# Patient Record
Sex: Male | Born: 1975 | Race: Black or African American | Hispanic: No | Marital: Single | Smoking: Current every day smoker
Health system: Southern US, Community
[De-identification: ages and names within clinical notes are randomized; demographics above are authoritative.]

## PROBLEM LIST (undated history)

## (undated) DIAGNOSIS — J329 Chronic sinusitis, unspecified: Secondary | ICD-10-CM

## (undated) DIAGNOSIS — R51 Headache: Secondary | ICD-10-CM

---

## 2009-09-01 ENCOUNTER — Emergency Department (HOSPITAL_COMMUNITY): Admission: EM | Admit: 2009-09-01 | Discharge: 2009-09-01 | Payer: Self-pay | Admitting: Emergency Medicine

## 2011-08-01 ENCOUNTER — Emergency Department (HOSPITAL_COMMUNITY)
Admission: EM | Admit: 2011-08-01 | Discharge: 2011-08-01 | Disposition: A | Discharge status: Home / Self Care | Payer: Self-pay | Attending: Emergency Medicine | Admitting: Emergency Medicine

## 2011-08-01 ENCOUNTER — Encounter (HOSPITAL_COMMUNITY): Payer: Self-pay | Admitting: Emergency Medicine

## 2011-08-01 DIAGNOSIS — F17200 Nicotine dependence, unspecified, uncomplicated: Secondary | ICD-10-CM | POA: Insufficient documentation

## 2011-08-01 DIAGNOSIS — J329 Chronic sinusitis, unspecified: Secondary | ICD-10-CM | POA: Insufficient documentation

## 2011-08-01 HISTORY — DX: Chronic sinusitis, unspecified: J32.9

## 2011-08-01 HISTORY — DX: Headache: R51

## 2011-08-01 MED ORDER — AMOXICILLIN 500 MG PO CAPS: 500.0000 mg | ORAL_CAPSULE | Freq: Three times a day (TID) | ORAL | Status: AC

## 2011-08-01 MED ORDER — FLUTICASONE PROPIONATE 50 MCG/ACT NA SUSP: NASAL | Status: DC

## 2011-08-01 NOTE — ED Notes (Signed)
Pt reports sinus infection with headache x 2 weeks. Pt has taken over the counter sinus meditation with ibuprofen without relief. Pt had nausea and vomiting yesterday, but none today.

## 2011-08-01 NOTE — ED Provider Notes (Signed)
History   This chart was scribed for Jason Melter, MD by Brooks Sailors. The patient was seen in room STRE5/STRE5. Patient's care was started at 1108.   CSN: [id number]  Arrival date & time 08/01/11  1108   First MD Initiated Contact with Patient 08/01/11 1205      Chief Complaint  Patient presents with  . Recurrent Sinusitis  . Headache    (Consider location/radiation/quality/duration/timing/severity/associated sxs/prior treatment) HPI  Jason Garcia is a 36 y.o. male who presents to the Emergency Department complaining of a sinus infection on the left side with associated migraines, eye burning, dizziness, and ear poping.  Pt says this is a reoccurring problem with last episode three years ago and always on left side. Pt has tried to take sinus OTC decongestant and algesics medicine without much relief. Denies sore throat, cough, SOB, and fever.    Past Medical History  Diagnosis Date  . Sinus infection   . Headache     History reviewed. No pertinent past surgical history.  History reviewed. No pertinent family history.  History  Substance Use Topics  . Smoking status: Current Everyday Smoker  . Smokeless tobacco: Not on file  . Alcohol Use: Yes      Review of Systems  All other systems reviewed and are negative.    Allergies  Review of patient's allergies indicates no known allergies.  Home Medications   Current Outpatient Rx  Name Route Sig Dispense Refill  . ACETAMINOPHEN 500 MG PO TABS Oral Take 1,000 mg by mouth every 6 (six) hours as needed. For headache    . IBUPROFEN 200 MG PO TABS Oral Take 200 mg by mouth every 6 (six) hours as needed. For headache    . PHENYLEPH-DOXYLAMINE-DM-APAP 5-6.25-10-325 MG PO CAPS Oral Take 2 capsules by mouth 2 (two) times daily as needed. For allergies    . AMOXICILLIN 500 MG PO CAPS Oral Take 1 capsule (500 mg total) by mouth 3 (three) times daily. 21 capsule 0  . FLUTICASONE PROPIONATE 50 MCG/ACT NA SUSP  1 spray  each nostril twice a day 16 g 0    BP 118/71  Pulse 68  Temp(Src) 98.6 F (37 C) (Oral)  Resp 14  Ht 6\' 1"  (1.854 m)  Wt 160 lb (72.576 kg)  BMI 21.11 kg/m2  SpO2 100%  Physical Exam  Nursing note and vitals reviewed. Constitutional: He is oriented to person, place, and time. He appears well-developed and well-nourished.  HENT:  Head: Normocephalic.  Right Ear: External ear normal.  Left Ear: External ear normal.       3cm circular mass on forehead, firm, nontender. Tenderness with percussion to left and frontal maxillary sinuses.  Eyes: Conjunctivae and EOM are normal. Pupils are equal, round, and reactive to light.  Neck: Normal range of motion and phonation normal. Neck supple.  Cardiovascular: Normal rate, regular rhythm, normal heart sounds and intact distal pulses.   Pulmonary/Chest: Effort normal and breath sounds normal. He exhibits no bony tenderness.  Abdominal: Normal appearance. There is no tenderness.  Musculoskeletal: Normal range of motion.  Neurological: He is alert and oriented to person, place, and time. He has normal strength. No cranial nerve deficit or sensory deficit. He exhibits normal muscle tone. Coordination normal.  Skin: Skin is warm, dry and intact.  Psychiatric: He has a normal mood and affect. His behavior is normal. Judgment and thought content normal.    ED Course  Procedures (including critical care time)  1225 Pt  seen and agrees with course of care.   Labs Reviewed - No data to display No results found.   1. Sinusitis       MDM  Eval. C/w bacterial sinusitis vs. Allergic sinusitis. Doubt metabolic instability, serious bacterial infection or impending vascular collapse; the patient is stable for discharge.   Plan: Home Medications- Amox., Flonase; Home Treatments- rest; Recommended follow up- PCP prn      I personally performed the services described in this documentation, which was scribed in my presence. The recorded  information has been reviewed and considered.     Jason Melter, MD 08/02/11 [phone]

## 2011-08-01 NOTE — Discharge Instructions (Signed)
Use of the medicines as directed. You can also use Afrin twice a day, to help with congestion, while using the nasal steroid medication. See the Dr. of your choice for problems.   Sinusitis Sinuses are air pockets within the bones of your face. The growth of bacteria within a sinus leads to infection. The infection prevents the sinuses from draining. This infection is called sinusitis. SYMPTOMS  There will be different areas of pain depending on which sinuses have become infected.  The maxillary sinuses often produce pain beneath the eyes.   Frontal sinusitis may cause pain in the middle of the forehead and above the eyes.  Other problems (symptoms) include:  Toothaches.   Colored, pus-like (purulent) drainage from the nose.   Swelling, warmth, and tenderness over the sinus areas may be signs of infection.  TREATMENT  Sinusitis is most often determined by an exam.X-rays may be taken. If x-rays have been taken, make sure you obtain your results or find out how you are to obtain them. Your caregiver may give you medications (antibiotics). These are medications that will help kill the bacteria causing the infection. You may also be given a medication (decongestant) that helps to reduce sinus swelling.  HOME CARE INSTRUCTIONS   Only take over-the-counter or prescription medicines for pain, discomfort, or fever as directed by your caregiver.   Drink extra fluids. Fluids help thin the mucus so your sinuses can drain more easily.   Applying either moist heat or ice packs to the sinus areas may help relieve discomfort.   Use saline nasal sprays to help moisten your sinuses. The sprays can be found at your local drugstore.  SEEK IMMEDIATE MEDICAL CARE IF:  You have a fever.   You have increasing pain, severe headaches, or toothache.   You have nausea, vomiting, or drowsiness.   You develop unusual swelling around the face or trouble seeing.  MAKE SURE YOU:   Understand these  instructions.   Will watch your condition.   Will get help right away if you are not doing well or get worse.  Document Released: 02/19/2005 Document Revised: 02/08/2011 Document Reviewed: 09/18/2006 [facility] Patient Information 2012 [place], [place].  RESOURCE GUIDE  Dental Problems  Patients with Medicaid: [facility] [phone] [address]                                           [phone] [address] Phone:  [phone]                                                  Phone:  [phone]  If unable to pay or uninsured, contact:  [facility] or [facility] to become qualified for the adult dental clinic.  Chronic Pain Problems Contact [facility]  [phone] Patients need to be referred by their primary care doctor.  Insufficient Money for Medicine Contact [employer]:  call "211" or [facility] [phone].  No Primary Care Doctor Call [facility]  [phone] Other agencies that provide inexpensive medical care    [facility]  [phone]    [facility]  [phone]    [facility]  [phone]    [facility]  [phone]    [facility]  [phone]    [facility]  [phone]  Psychological Services [facility]  [phone] [facility]  [phone] [facility]   [phone] (emergency services [phone])  Substance Abuse Resources [facility]  [phone] [facility] [phone] [facility] [place] [phone] Floydene Flock 5077811569 Residential & Outpatient Substance Abuse Program  [phone]  Abuse/Neglect [facility] Child Abuse Hotline 503-184-7013 [facility] Child Abuse Hotline 218-332-0574 (After Hours)  Emergency Shelter [facility] [phone]  Maternity Homes [facility] at the [place] [facility] [place] [phone] Rebeca Alert  Services [phone]  MRSA Hotline #:   857 318 8916    [facility] Resources  [facility]     [employer]                          [facility] Dept. [address] [place]                       [address]      [address]  Blondell Reveal Phone:  [phone]                                   Phone:  [phone]                 Phone:  [phone]  [facility] Phone:  [phone]  [facility] Child Abuse Hotline 817-143-2089 [phone] (After Hours)

## 2011-08-01 NOTE — ED Notes (Signed)
Discharge instructions reviewed with pt; verbalizes understanding.  No questions asked; no further c/o's voiced.  Pt ambulatory to lobby.  NAD noted. 

## 2011-11-25 ENCOUNTER — Emergency Department (HOSPITAL_COMMUNITY): Payer: Self-pay

## 2011-11-25 ENCOUNTER — Emergency Department (HOSPITAL_COMMUNITY)
Admission: EM | Admit: 2011-11-25 | Discharge: 2011-11-25 | Disposition: A | Discharge status: Home / Self Care | Payer: Self-pay | Attending: Emergency Medicine | Admitting: Emergency Medicine

## 2011-11-25 DIAGNOSIS — S62309A Unspecified fracture of unspecified metacarpal bone, initial encounter for closed fracture: Secondary | ICD-10-CM

## 2011-11-25 DIAGNOSIS — S62319A Displaced fracture of base of unspecified metacarpal bone, initial encounter for closed fracture: Secondary | ICD-10-CM | POA: Insufficient documentation

## 2011-11-25 DIAGNOSIS — Y998 Other external cause status: Secondary | ICD-10-CM | POA: Insufficient documentation

## 2011-11-25 DIAGNOSIS — F17200 Nicotine dependence, unspecified, uncomplicated: Secondary | ICD-10-CM | POA: Insufficient documentation

## 2011-11-25 DIAGNOSIS — Y93I9 Activity, other involving external motion: Secondary | ICD-10-CM | POA: Insufficient documentation

## 2011-11-25 MED ORDER — OXYCODONE-ACETAMINOPHEN 5-325 MG PO TABS
2.0000 | ORAL_TABLET | Freq: Once | ORAL | Status: AC
  Administered 2011-11-25: 2 via ORAL
  Filled 2011-11-25: qty 2

## 2011-11-25 NOTE — ED Notes (Signed)
Patient transported to X-ray 
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
## 2011-11-25 NOTE — ED Notes (Signed)
Pt medicated for pain. Pt has a ride home.
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
## 2011-11-27 NOTE — ED Provider Notes (Signed)
Medical screening examination/treatment/procedure(s) were performed by non-physician practitioner and as supervising physician I was immediately available for consultation/collaboration.  Flint Melter, MD 11/27/11 2044

## 2016-10-09 ENCOUNTER — Emergency Department (HOSPITAL_COMMUNITY): Payer: Medicaid Other

## 2016-10-09 ENCOUNTER — Encounter (HOSPITAL_COMMUNITY): Payer: Self-pay | Admitting: Emergency Medicine

## 2016-10-09 ENCOUNTER — Emergency Department (HOSPITAL_COMMUNITY)
Admission: EM | Admit: 2016-10-09 | Discharge: 2016-10-09 | Disposition: A | Discharge status: Home / Self Care | Payer: Medicaid Other | Attending: Emergency Medicine | Admitting: Emergency Medicine

## 2016-10-09 DIAGNOSIS — N50819 Testicular pain, unspecified: Secondary | ICD-10-CM | POA: Diagnosis not present

## 2016-10-09 DIAGNOSIS — F17200 Nicotine dependence, unspecified, uncomplicated: Secondary | ICD-10-CM | POA: Insufficient documentation

## 2016-10-09 DIAGNOSIS — N451 Epididymitis: Secondary | ICD-10-CM | POA: Diagnosis not present

## 2016-10-09 DIAGNOSIS — R1030 Lower abdominal pain, unspecified: Secondary | ICD-10-CM

## 2016-10-09 MED ORDER — DOXYCYCLINE HYCLATE 100 MG PO CAPS: 100.0000 mg | ORAL_CAPSULE | Freq: Two times a day (BID) | ORAL | 0 refills | Status: DC

## 2016-10-09 MED ORDER — IBUPROFEN 800 MG PO TABS
800.0000 mg | ORAL_TABLET | Freq: Once | ORAL | Status: AC
  Administered 2016-10-09: 800 mg via ORAL
  Filled 2016-10-09: qty 1

## 2016-10-09 MED ORDER — AZITHROMYCIN 250 MG PO TABS
1000.0000 mg | ORAL_TABLET | Freq: Once | ORAL | Status: AC
  Administered 2016-10-09: 1000 mg via ORAL
  Filled 2016-10-09: qty 4

## 2016-10-09 MED ORDER — PROMETHAZINE HCL 12.5 MG PO TABS
12.5000 mg | ORAL_TABLET | Freq: Once | ORAL | Status: AC
  Administered 2016-10-09: 12.5 mg via ORAL
  Filled 2016-10-09: qty 1

## 2016-10-09 MED ORDER — HYDROCODONE-ACETAMINOPHEN 5-325 MG PO TABS
2.0000 | ORAL_TABLET | Freq: Once | ORAL | Status: AC
  Administered 2016-10-09: 2 via ORAL
  Filled 2016-10-09: qty 2

## 2016-10-09 MED ORDER — CEFTRIAXONE SODIUM 250 MG IJ SOLR
250.0000 mg | Freq: Once | INTRAMUSCULAR | Status: AC
  Administered 2016-10-09: 250 mg via INTRAMUSCULAR
  Filled 2016-10-09: qty 250

## 2016-10-09 MED ORDER — HYDROCODONE-ACETAMINOPHEN 5-325 MG PO TABS: 1.0000 | ORAL_TABLET | ORAL | 0 refills | Status: DC | PRN

## 2016-10-09 MED ORDER — IBUPROFEN 600 MG PO TABS: 600.0000 mg | ORAL_TABLET | Freq: Four times a day (QID) | ORAL | 0 refills | Status: DC

## 2016-10-09 NOTE — ED Notes (Signed)
Pt is c/o left testicle pain rating pain 10/10.  Pain is constant, tingling, sharp and shooting to left side since Saturday.
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
## 2016-10-09 NOTE — ED Notes (Signed)
PA at bedside.

## 2016-10-09 NOTE — ED Notes (Signed)
US in progress

## 2016-10-09 NOTE — ED Notes (Signed)
Informed H. Beverely PaceBryant of pts issue.  Removed lower garments.

## 2016-10-09 NOTE — Discharge Instructions (Signed)
Your examination and your ultrasound suggests epididymitis, and infection/inflammation of one of the cords around your testicle. You retreated in the emergency department with Rocephin and doxycycline. Please use doxycycline 2 times daily with food until all taken. Use 600 mg of ibuprofen with breakfast, lunch, dinner, and at bedtime. May use Norco for more severe pain if needed. This medicine may cause drowsiness, please use with caution.
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
## 2018-10-18 ENCOUNTER — Encounter (HOSPITAL_COMMUNITY): Payer: Self-pay | Admitting: Emergency Medicine

## 2018-10-18 ENCOUNTER — Ambulatory Visit (HOSPITAL_COMMUNITY)
Admission: EM | Admit: 2018-10-18 | Discharge: 2018-10-18 | Disposition: A | Discharge status: Home / Self Care | Payer: Medicaid Other | Attending: Family Medicine | Admitting: Family Medicine

## 2018-10-18 ENCOUNTER — Other Ambulatory Visit: Payer: Self-pay

## 2018-10-18 DIAGNOSIS — J029 Acute pharyngitis, unspecified: Secondary | ICD-10-CM | POA: Diagnosis not present

## 2018-10-18 DIAGNOSIS — Z1159 Encounter for screening for other viral diseases: Secondary | ICD-10-CM | POA: Diagnosis not present

## 2018-10-18 DIAGNOSIS — R000 Tachycardia, unspecified: Secondary | ICD-10-CM | POA: Diagnosis not present

## 2018-10-18 DIAGNOSIS — Z20828 Contact with and (suspected) exposure to other viral communicable diseases: Secondary | ICD-10-CM | POA: Insufficient documentation

## 2018-10-18 DIAGNOSIS — R509 Fever, unspecified: Secondary | ICD-10-CM | POA: Insufficient documentation

## 2018-10-18 LAB — POCT RAPID STREP A: Streptococcus, Group A Screen (Direct): NEGATIVE

## 2018-10-18 MED ORDER — ACETAMINOPHEN 325 MG PO TABS
ORAL_TABLET | ORAL | Status: AC
  Filled 2018-10-18: qty 3

## 2018-10-18 MED ORDER — ACETAMINOPHEN 500 MG PO TABS: 1000.0000 mg | ORAL_TABLET | Freq: Once | ORAL | Status: DC

## 2018-10-18 MED ORDER — ACETAMINOPHEN 325 MG PO TABS
975.0000 mg | ORAL_TABLET | Freq: Once | ORAL | Status: AC
  Administered 2018-10-18: 975 mg via ORAL

## 2018-10-18 MED ORDER — NAPROXEN 500 MG PO TABS: 500.0000 mg | ORAL_TABLET | Freq: Two times a day (BID) | ORAL | 0 refills | Status: DC

## 2018-10-18 MED ORDER — PENICILLIN G BENZATHINE 1200000 UNIT/2ML IM SUSP
1.2000 10*6.[IU] | Freq: Once | INTRAMUSCULAR | Status: AC
  Administered 2018-10-18: 16:00:00 1.2 10*6.[IU] via INTRAMUSCULAR

## 2018-10-18 MED ORDER — PENICILLIN G BENZATHINE 1200000 UNIT/2ML IM SUSP
INTRAMUSCULAR | Status: AC
  Filled 2018-10-18: qty 2

## 2018-10-18 NOTE — ED Provider Notes (Addendum)
MRN: [id number] DOB: Aug 02, 1975  Subjective:   Jason Garcia is a 43 y.o. male presenting for 2 day history of acute onset moderate worsening throat pain, difficulty speaking and swallowing. Has also felt feverish. Has not taken medications for relief. No known COVID 19. 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  Tried to self quarantine and practice social distancing.  Denies taking any chronic medications.   No Known Allergies  Past Medical History:  Diagnosis Date  . Headache(784.0)   . Sinus infection      History reviewed. No pertinent surgical history.  Review of Systems  Constitutional: Positive for fever. Negative for malaise/fatigue.  HENT: Positive for sore throat. Negative for congestion, ear pain and sinus pain.   Eyes: Negative for blurred vision, double vision, discharge and redness.  Respiratory: Negative for cough, hemoptysis, shortness of breath and wheezing.   Cardiovascular: Negative for chest pain.  Gastrointestinal: Negative for abdominal pain, diarrhea, nausea and vomiting.  Genitourinary: Negative for dysuria, flank pain and hematuria.  Musculoskeletal: Negative for myalgias.  Skin: Negative for rash.  Neurological: Negative for dizziness, weakness and headaches.  Psychiatric/Behavioral: Negative for depression and substance abuse.    Objective:   Vitals: BP 127/84   Pulse (!) 120   Temp 100.1 F (37.8 C)   Resp 20   SpO2 100%   Temp was 104.9 on recheck by PA-Amana Bouska.   Physical Exam Constitutional:      General: He is not in acute distress.    Appearance: Normal appearance. He is well-developed and normal weight. He is ill-appearing and diaphoretic. He is not toxic-appearing.  HENT:     Head: Normocephalic and atraumatic.     Right Ear: Tympanic membrane, ear canal and external ear normal. There is no impacted cerumen.     Left Ear: Tympanic membrane, ear canal and external ear normal. There is no impacted cerumen.      Nose: Nose normal. No congestion or rhinorrhea.     Mouth/Throat:     Mouth: Mucous membranes are moist. No oral lesions.     Pharynx: Oropharynx is clear. Posterior oropharyngeal erythema present. No pharyngeal swelling, oropharyngeal exudate or uvula swelling.  Eyes:     General: No scleral icterus.       Right eye: No discharge.        Left eye: No discharge.     Extraocular Movements: Extraocular movements intact.     Conjunctiva/sclera: Conjunctivae normal.     Pupils: Pupils are equal, round, and reactive to light.  Neck:     Musculoskeletal: Normal range of motion and neck supple. No neck rigidity or muscular tenderness.  Cardiovascular:     Rate and Rhythm: Regular rhythm. Tachycardia present.     Heart sounds: Normal heart sounds. No murmur. No friction rub. No gallop.   Pulmonary:     Effort: Pulmonary effort is normal. No respiratory distress.     Breath sounds: Normal breath sounds. No stridor. No wheezing, rhonchi or rales.  Skin:    General: Skin is warm.  Neurological:     General: No focal deficit present.     Mental Status: He is alert and oriented to person, place, and time.  Psychiatric:        Mood and Affect: Mood normal.        Behavior: Behavior normal.        Thought Content: Thought content normal.     Results for orders placed or performed during the hospital encounter of  10/18/18 (from the past 24 hour(s))  POCT rapid strep A [facility]     Status: None   Collection Time: 10/18/18  3:43 PM  Result Value Ref Range   Streptococcus, Group A Screen (Direct) NEGATIVE NEGATIVE    Assessment and Plan :   1. Acute pharyngitis, unspecified etiology   2. Fever, unspecified     COVID 19 testing pending. Will treat for pharyngitis empirically with penicillin IM, 975 mg of APAP in clinic. Culture is pending. Recommended supportive care. Counseled patient on potential for adverse effects with medications prescribed/recommended today, ER and  return-to-clinic precautions discussed, patient verbalized understanding.    Wallis BambergMani, Kadence Mimbs, [place]PA-C 10/18/18 1615

## 2018-10-19 LAB — CULTURE, GROUP A STREP (THRC)

## 2018-10-19 LAB — NOVEL CORONAVIRUS, NAA (HOSP ORDER, SEND-OUT TO REF LAB; TAT 18-24 HRS): SARS-CoV-2, NAA: NOT DETECTED

## 2019-09-18 ENCOUNTER — Other Ambulatory Visit: Payer: Self-pay

## 2019-09-18 ENCOUNTER — Ambulatory Visit (HOSPITAL_COMMUNITY)
Admission: EM | Admit: 2019-09-18 | Discharge: 2019-09-18 | Disposition: A | Discharge status: Home / Self Care | Payer: Medicaid Other | Attending: Physician Assistant | Admitting: Physician Assistant

## 2019-09-18 ENCOUNTER — Encounter (HOSPITAL_COMMUNITY): Payer: Self-pay

## 2019-09-18 DIAGNOSIS — M5412 Radiculopathy, cervical region: Secondary | ICD-10-CM | POA: Diagnosis not present

## 2019-09-18 DIAGNOSIS — M62838 Other muscle spasm: Secondary | ICD-10-CM

## 2019-09-18 MED ORDER — PREDNISONE 10 MG PO TABS: 40.0000 mg | ORAL_TABLET | Freq: Every day | ORAL | 0 refills | Status: AC

## 2019-09-18 MED ORDER — TIZANIDINE HCL 4 MG PO TABS: 4.0000 mg | ORAL_TABLET | Freq: Every day | ORAL | 0 refills | Status: AC

## 2019-09-18 MED ORDER — ACETAMINOPHEN 325 MG PO TABS: 650.0000 mg | ORAL_TABLET | Freq: Four times a day (QID) | ORAL | 0 refills | Status: AC | PRN

## 2019-09-18 NOTE — Discharge Instructions (Signed)
Take the prednisone for the next 5 days Take the Zanaflex at night, do not drive, drink alcohol or operate machinery within 8 hours of taking this.  It will make you drowsy. You may take Tylenol 2 regular strength as prescribed every 6 hours for additional pain relief Once you have completed the prednisone you may take 2 regular strength ibuprofen every 6 hours, do not take this while taking the prednisone  Establish care with internal medicine center  If not improving over the next 2 to 3 days schedule appointment with the sports medicine group of your symptoms today  If acutely worsening, severe pain please return or go to the emergency department.

## 2019-09-18 NOTE — ED Triage Notes (Signed)
Pt reports right side neck pain, radiates to right shoulder x 1 week. Denies trauma, fall. Pt had not take any medication for the pain.

## 2019-09-18 NOTE — ED Provider Notes (Addendum)
[facility]    CSN: [id number] Arrival date & time: 09/18/19  1526      History   Chief Complaint Chief Complaint  Patient presents with  . Neck Injury  . Shoulder Pain    HPI Jason Garcia is a 44 y.o. male.   Patient reports for the last 1 week he has had right-sided neck pain radiating into his shoulder.  He reports he woke up 1 morning felt the pain in his neck.  This is gradually increased.  He reports that shoots down his shoulder at times.  Reports occasional shooting pain down his arm.  Occasional feels like tingling in the right arm.  He denies injury or trauma.  Denies history of injury or trauma.  Does not notice any fevers, chills.  No weakness.  He has tried hot and cold compresses.  He wonders if he slept wrong.     Past Medical History:  Diagnosis Date  . Headache(784.0)   . Sinus infection     There are no problems to display for this patient.   History reviewed. No pertinent surgical history.     Home Medications    Prior to Admission medications   Medication Sig Start Date End Date Taking? Authorizing Provider  acetaminophen (TYLENOL) 325 MG tablet Take 2 tablets (650 mg total) by mouth every 6 (six) hours as needed. 09/18/19   Rahmel Nedved, Veryl Speak, PA-C  naproxen (NAPROSYN) 500 MG tablet Take 1 tablet (500 mg total) by mouth 2 (two) times daily. 10/18/18   Wallis Bamberg, PA-C  predniSONE (DELTASONE) 10 MG tablet Take 4 tablets (40 mg total) by mouth daily for 5 days. 09/18/19 09/23/19  Laiden Milles, Veryl Speak, PA-C  tiZANidine (ZANAFLEX) 4 MG tablet Take 1 tablet (4 mg total) by mouth at bedtime for 10 days. 09/18/19 09/28/19  Renley Gutman, Veryl Speak, PA-C    Family History History reviewed. No pertinent family history.  Social History Social History   Tobacco Use  . Smoking status: Current Every Day Smoker  . Smokeless tobacco: Never Used  Substance Use Topics  . Alcohol use: Yes  . Drug use: No     Allergies   Patient has no known  allergies.   Review of Systems Review of Systems   Physical Exam Triage Vital Signs ED Triage Vitals [09/18/19 1559]  Enc Vitals Group     BP 129/83     Pulse Rate 84     Resp 17     Temp 98.4 F (36.9 C)     Temp Source Oral     SpO2 99 %     Weight      Height      Head Circumference      Peak Flow      Pain Score      Pain Loc      Pain Edu?      Excl. in GC?    No data found.  Updated Vital Signs BP 129/83 (BP Location: Right Arm)   Pulse 84   Temp 98.4 F (36.9 C) (Oral)   Resp 17   SpO2 99%   Visual Acuity Right Eye Distance:   Left Eye Distance:   Bilateral Distance:    Right Eye Near:   Left Eye Near:    Bilateral Near:     Physical Exam Vitals and nursing note reviewed.  Constitutional:      Appearance: He is well-developed.  HENT:     Head: Normocephalic and atraumatic.  Eyes:     Conjunctiva/sclera: Conjunctivae normal.  Neck:     Comments: There is right-sided paracervical tenderness, no midline tenderness.  Full range of motion of the neck.  Some pain elicited with extension and looking to the right.  No relief of pain with arm overhead. Cardiovascular:     Rate and Rhythm: Normal rate.  Pulmonary:     Effort: Pulmonary effort is normal. No respiratory distress.  Musculoskeletal:     Cervical back: Neck supple.     Comments: Patient has full range of motion of the upper extremities.  There is no deformity of either upper extremity  There is tenderness across the right trapezius.  No right-sided shoulder joint bony tenderness.  5/5 strength in the upper extremities.  Sensation intact equal bilaterally in the upper extremities.  Distal pulses 2+.  Cap refill less than 2 seconds.   Skin:    General: Skin is warm and dry.  Neurological:     General: No focal deficit present.     Mental Status: He is alert and oriented to person, place, and time.     Sensory: No sensory deficit.     Motor: No weakness.      UC Treatments /  Results  Labs (all labs ordered are listed, but only abnormal results are displayed) Labs Reviewed - No data to display  EKG   Radiology No results found.  Procedures Procedures (including critical care time)  Medications Ordered in UC Medications - No data to display  Initial Impression / Assessment and Plan / UC Course  I have reviewed the triage vital signs and the nursing notes.  Pertinent labs & imaging results that were available during my care of the patient were reviewed by me and considered in my medical decision making (see chart for details).     #Neck muscle spasm #Cervical radiculopathy Patient is a 44 year old gentleman presenting with neck muscle spasm and likely cervical radiculopathy.  Though pain does not improve with arm over the head, given there is some  radiation of pain down the arm and some reported neurologic symptoms, likely there is an aspect of radiculopathy.  No red flag symptoms today.  Will trial prednisone and muscle relaxer.  We will have him follow-up with sports medicine as needed.  Highly encouraged to establish with primary care.  Return and follow-up precautions were discussed.  Patient verbalized understanding plan of care. Final Clinical Impressions(s) / UC Diagnoses   Final diagnoses:  Neck muscle spasm  Cervical radiculopathy     Discharge Instructions     Take the prednisone for the next 5 days Take the Zanaflex at night, do not drive, drink alcohol or operate machinery within 8 hours of taking this.  It will make you drowsy. You may take Tylenol 2 regular strength as prescribed every 6 hours for additional pain relief Once you have completed the prednisone you may take 2 regular strength ibuprofen every 6 hours, do not take this while taking the prednisone  Establish care with internal medicine center  If not improving over the next 2 to 3 days schedule appointment with the sports medicine group of your symptoms today  If  acutely worsening, severe pain please return or go to the emergency department.     ED Prescriptions    Medication Sig Dispense Auth. Provider   predniSONE (DELTASONE) 10 MG tablet Take 4 tablets (40 mg total) by mouth daily for 5 days. 20 tablet Jeni Duling, Veryl Speak, PA-C  tiZANidine (ZANAFLEX) 4 MG tablet Take 1 tablet (4 mg total) by mouth at bedtime for 10 days. 10 tablet Dalon Reichart, Veryl Speak, PA-C   acetaminophen (TYLENOL) 325 MG tablet Take 2 tablets (650 mg total) by mouth every 6 (six) hours as needed. 30 tablet Demyan Fugate, Veryl Speak, PA-C     PDMP not reviewed this encounter.   Hermelinda Medicus, PA-C 09/19/19 1001    Christan Ciccarelli, Veryl Speak, PA-C 09/19/19 1001

## 2019-12-18 ENCOUNTER — Ambulatory Visit (HOSPITAL_COMMUNITY)
Admission: EM | Admit: 2019-12-18 | Discharge: 2019-12-18 | Disposition: A | Discharge status: Home / Self Care | Payer: Medicaid Other | Attending: Family Medicine | Admitting: Family Medicine

## 2019-12-18 ENCOUNTER — Other Ambulatory Visit: Payer: Self-pay

## 2019-12-18 ENCOUNTER — Encounter (HOSPITAL_COMMUNITY): Payer: Self-pay

## 2019-12-18 ENCOUNTER — Ambulatory Visit (INDEPENDENT_AMBULATORY_CARE_PROVIDER_SITE_OTHER): Payer: Medicaid Other

## 2019-12-18 DIAGNOSIS — M25552 Pain in left hip: Secondary | ICD-10-CM | POA: Diagnosis not present

## 2019-12-18 DIAGNOSIS — M25522 Pain in left elbow: Secondary | ICD-10-CM | POA: Diagnosis not present

## 2019-12-18 DIAGNOSIS — M25562 Pain in left knee: Secondary | ICD-10-CM

## 2019-12-18 DIAGNOSIS — S5002XA Contusion of left elbow, initial encounter: Secondary | ICD-10-CM | POA: Diagnosis not present

## 2019-12-18 MED ORDER — OXYCODONE-ACETAMINOPHEN 5-325 MG PO TABS
1.0000 | ORAL_TABLET | Freq: Three times a day (TID) | ORAL | 0 refills | Status: AC | PRN
Start: 2019-12-18 — End: 2019-12-22

## 2019-12-18 MED ORDER — KETOROLAC TROMETHAMINE 30 MG/ML IJ SOLN
INTRAMUSCULAR | Status: AC
  Filled 2019-12-18: qty 1

## 2019-12-18 MED ORDER — OXYCODONE-ACETAMINOPHEN 5-325 MG PO TABS
2.0000 | ORAL_TABLET | Freq: Three times a day (TID) | ORAL | 0 refills | Status: DC | PRN
Start: 2019-12-18 — End: 2019-12-18

## 2019-12-18 MED ORDER — KETOROLAC TROMETHAMINE 30 MG/ML IJ SOLN
30.0000 mg | Freq: Once | INTRAMUSCULAR | Status: AC
  Administered 2019-12-18: 30 mg via INTRAMUSCULAR
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
## 2019-12-18 NOTE — ED Triage Notes (Signed)
Patient c/o LFT elbow, knee, and hip pain x 6 days.  Patient took Tylenol at home w/o any relief of symptoms.   Patient is alert and oriented x 4. Patient is ambulatory with cane.

## 2019-12-18 NOTE — Discharge Instructions (Addendum)
Thank you for coming in to see [place] today! Please see below to review our plan for today's visit:  1. I have sent a prescription for Percocet to your pharmacy for pain management.  You can take 1 tablet every 8 hours as needed for severe pain.  Please note that this medication can make you sleepy.  Please do not take this medication for you have to drive.  In the meantime, I recommend that you continue to use ibuprofen 200 mg with Tylenol 500 mg together every 4-6 hours together as needed. 2.  Use warm/cool compresses on your body to help relax muscles and decrease inflammation. 3. Please follow up with your primary care provider for this visit.   It was our pleasure to serve you!   Dr. Peggyann Shoals [facility]
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
## 2019-12-23 DIAGNOSIS — S5002XA Contusion of left elbow, initial encounter: Secondary | ICD-10-CM | POA: Diagnosis present

## 2020-06-01 ENCOUNTER — Ambulatory Visit (HOSPITAL_COMMUNITY)
Admission: EM | Admit: 2020-06-01 | Discharge: 2020-06-01 | Disposition: A | Discharge status: Home / Self Care | Payer: Medicaid Other | Attending: Physician Assistant | Admitting: Physician Assistant

## 2020-06-01 ENCOUNTER — Encounter (HOSPITAL_COMMUNITY): Payer: Self-pay

## 2020-06-01 ENCOUNTER — Ambulatory Visit (INDEPENDENT_AMBULATORY_CARE_PROVIDER_SITE_OTHER): Payer: Medicaid Other

## 2020-06-01 ENCOUNTER — Other Ambulatory Visit: Payer: Self-pay

## 2020-06-01 DIAGNOSIS — M25572 Pain in left ankle and joints of left foot: Secondary | ICD-10-CM

## 2020-06-01 DIAGNOSIS — S82832A Other fracture of upper and lower end of left fibula, initial encounter for closed fracture: Secondary | ICD-10-CM | POA: Diagnosis not present

## 2020-06-01 DIAGNOSIS — W19XXXA Unspecified fall, initial encounter: Secondary | ICD-10-CM | POA: Diagnosis not present

## 2020-06-01 DIAGNOSIS — S99912A Unspecified injury of left ankle, initial encounter: Secondary | ICD-10-CM | POA: Diagnosis not present

## 2020-06-01 MED ORDER — HYDROCODONE-ACETAMINOPHEN 5-325 MG PO TABS: 1.0000 | ORAL_TABLET | Freq: Four times a day (QID) | ORAL | 0 refills | Status: AC | PRN
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
## 2021-10-21 IMAGING — DX DG ELBOW COMPLETE 3+V*L*
4 series · 4 of 4 positions shown · non-contrast
Comparison: None.

CLINICAL DATA: Left elbow pain for 6 days

EXAM:
LEFT ELBOW - COMPLETE 3+ VIEW

[elbow ap]
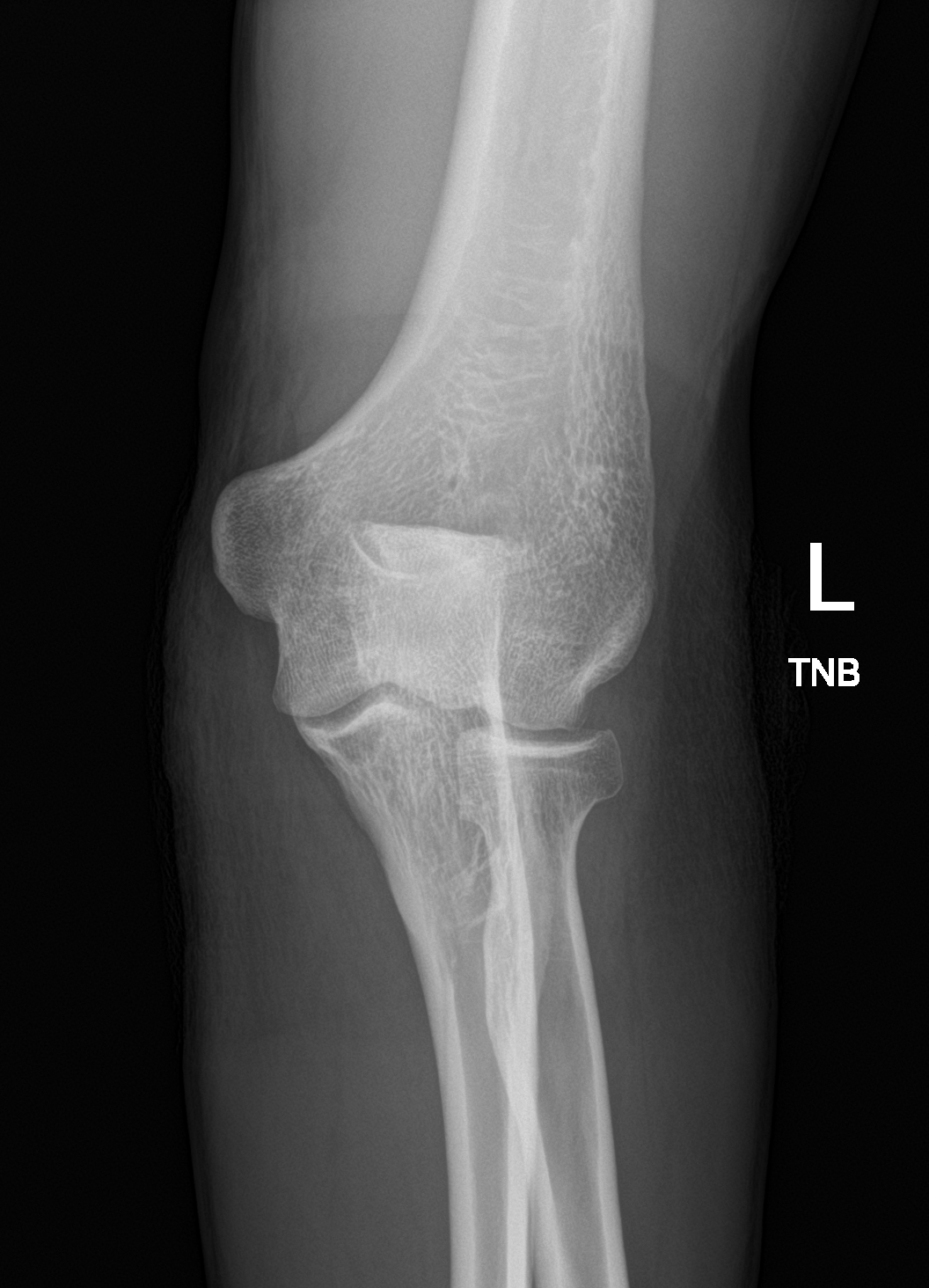

[elbow obl (1 of 2)]
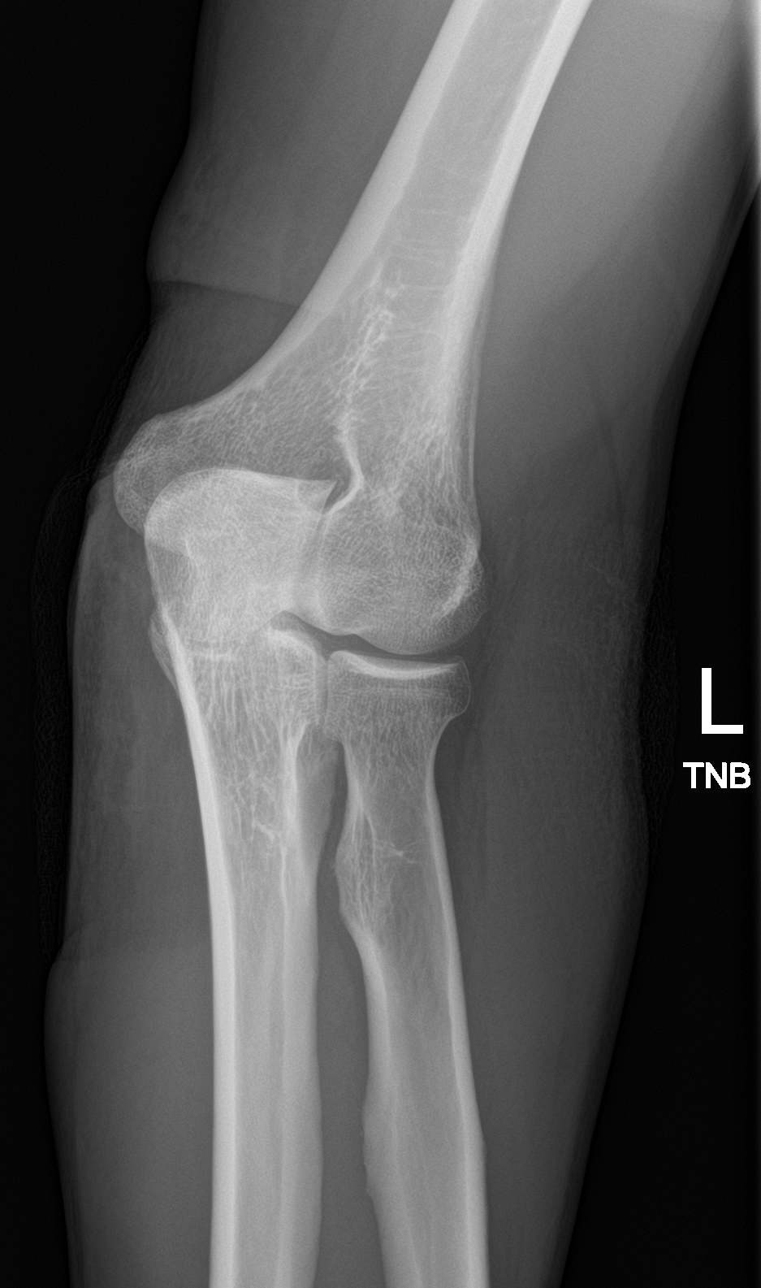

[elbow obl (2 of 2)]
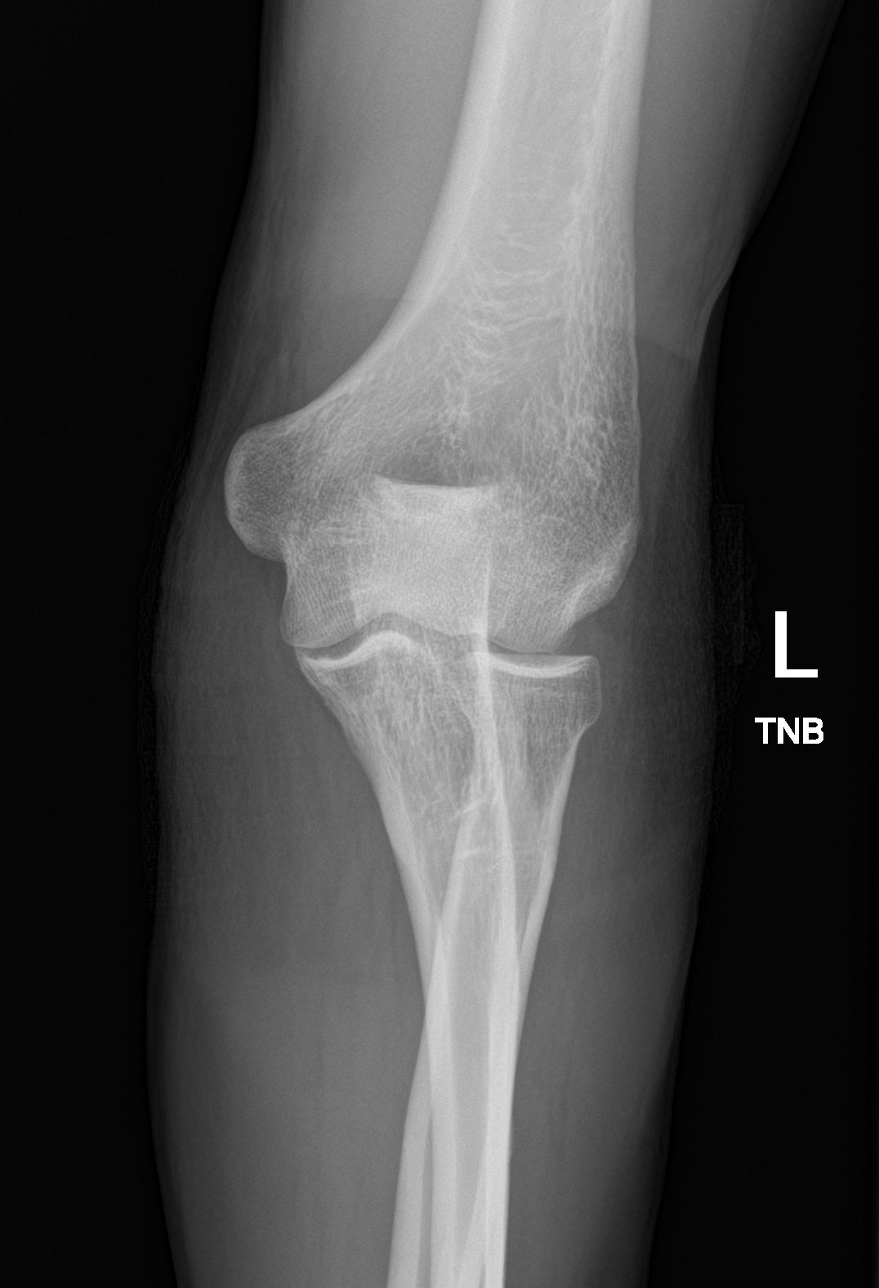

[elbow lat]
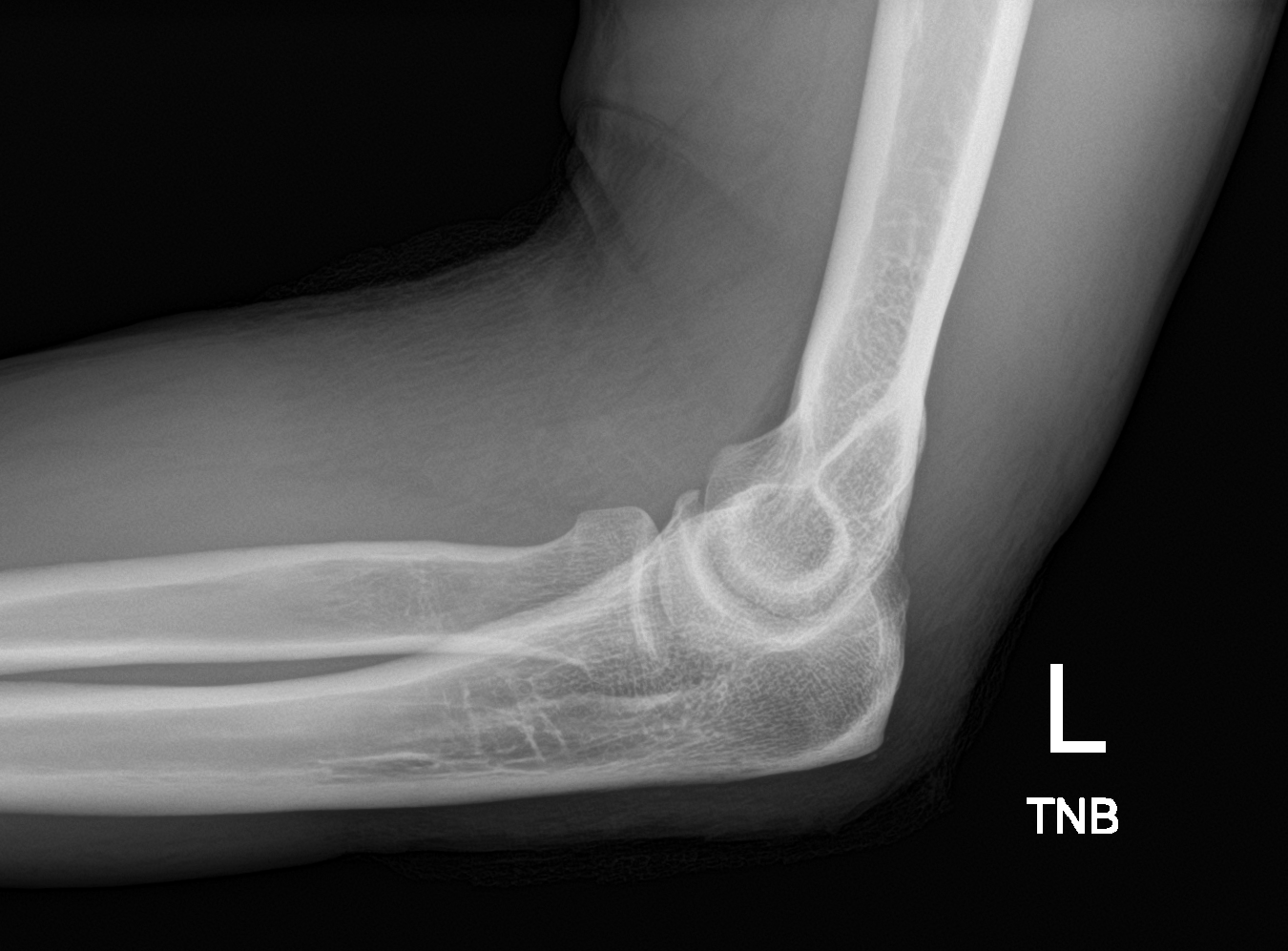

[4 of 4 positions shown; findings below may reference images not displayed]

FINDINGS: There is no evidence of fracture, dislocation, or joint effusion.
There is no evidence of arthropathy or other focal bone abnormality.
Mild diffuse soft tissue swelling.
IMPRESSION: No acute osseous abnormality. Mild diffuse soft tissue swelling.

## 2022-04-05 IMAGING — DX DG FOOT COMPLETE 3+V*L*
3 series · 3 of 3 positions shown · non-contrast
Comparison: None.

CLINICAL DATA: Foot pain for fall

EXAM:
LEFT FOOT - COMPLETE 3+ VIEW

[foot ap]
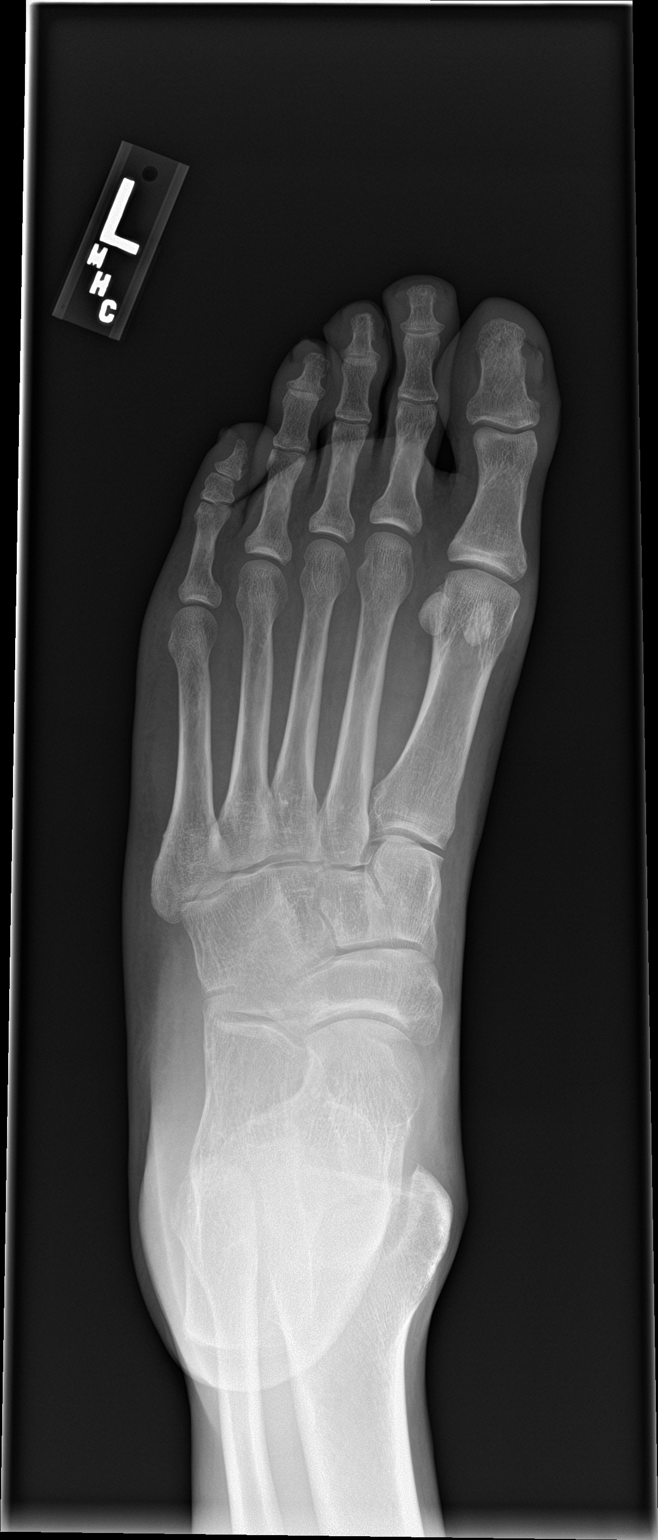

[foot obl]
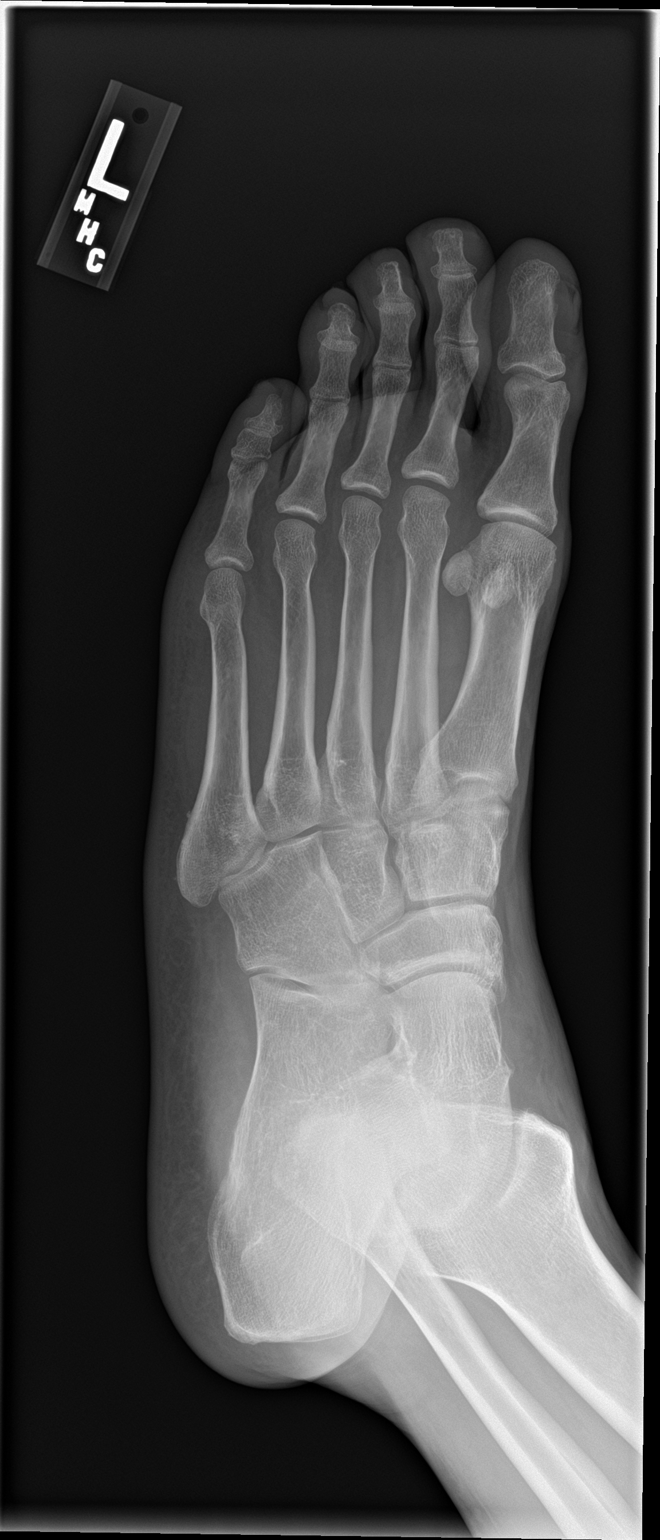

[foot lat]
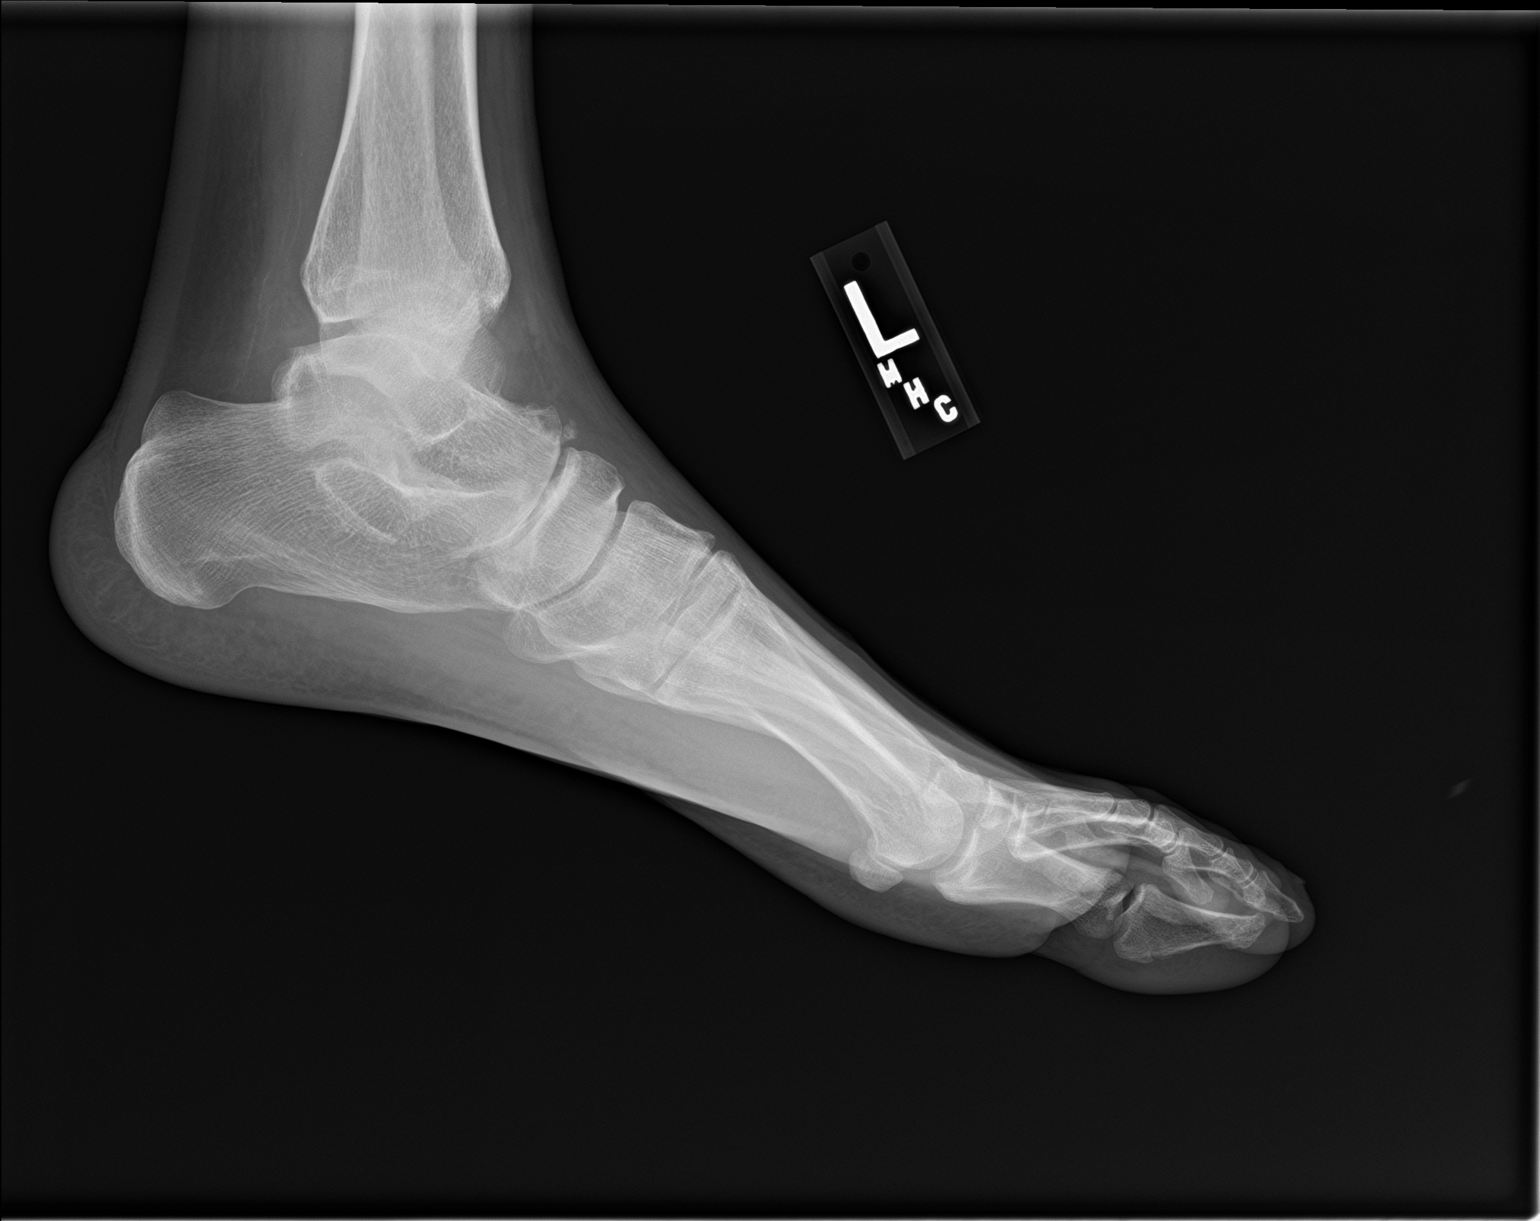

[3 of 3 positions shown; findings below may reference images not displayed]

FINDINGS: There is no evidence of fracture or dislocation. There is no
evidence of arthropathy or other focal bone abnormality. Soft
tissues are unremarkable.
IMPRESSION: Negative.
